# Patient Record
Sex: Male | Born: 1965 | Race: White | Hispanic: No | Marital: Married | Smoking: Never smoker
Health system: Southern US, Community
[De-identification: ages and names within clinical notes are randomized; demographics above are authoritative.]

## PROBLEM LIST (undated history)

## (undated) DIAGNOSIS — K52832 Lymphocytic colitis: Secondary | ICD-10-CM

## (undated) DIAGNOSIS — I10 Essential (primary) hypertension: Secondary | ICD-10-CM

## (undated) DIAGNOSIS — E7800 Pure hypercholesterolemia, unspecified: Secondary | ICD-10-CM

## (undated) DIAGNOSIS — F419 Anxiety disorder, unspecified: Secondary | ICD-10-CM

## (undated) HISTORY — PX: TONSILLECTOMY AND ADENOIDECTOMY: SUR1326

## (undated) HISTORY — DX: Pure hypercholesterolemia, unspecified: E78.00

## (undated) HISTORY — DX: Lymphocytic colitis: K52.832

## (undated) HISTORY — PX: FOOT SURGERY: SHX648

---

## 2011-12-18 ENCOUNTER — Encounter: Payer: Self-pay | Admitting: *Deleted

## 2013-02-03 ENCOUNTER — Emergency Department (HOSPITAL_BASED_OUTPATIENT_CLINIC_OR_DEPARTMENT_OTHER): Payer: BC Managed Care – PPO

## 2013-02-03 ENCOUNTER — Encounter (HOSPITAL_BASED_OUTPATIENT_CLINIC_OR_DEPARTMENT_OTHER): Payer: Self-pay | Admitting: *Deleted

## 2013-02-03 ENCOUNTER — Emergency Department (HOSPITAL_BASED_OUTPATIENT_CLINIC_OR_DEPARTMENT_OTHER)
Admission: EM | Admit: 2013-02-03 | Discharge: 2013-02-03 | Disposition: A | Discharge status: Home / Self Care | Payer: BC Managed Care – PPO | Attending: Emergency Medicine | Admitting: Emergency Medicine

## 2013-02-03 DIAGNOSIS — Z862 Personal history of diseases of the blood and blood-forming organs and certain disorders involving the immune mechanism: Secondary | ICD-10-CM | POA: Insufficient documentation

## 2013-02-03 DIAGNOSIS — F411 Generalized anxiety disorder: Secondary | ICD-10-CM | POA: Insufficient documentation

## 2013-02-03 DIAGNOSIS — Z79899 Other long term (current) drug therapy: Secondary | ICD-10-CM | POA: Insufficient documentation

## 2013-02-03 DIAGNOSIS — R4789 Other speech disturbances: Secondary | ICD-10-CM | POA: Insufficient documentation

## 2013-02-03 DIAGNOSIS — R42 Dizziness and giddiness: Secondary | ICD-10-CM | POA: Insufficient documentation

## 2013-02-03 DIAGNOSIS — I10 Essential (primary) hypertension: Secondary | ICD-10-CM | POA: Insufficient documentation

## 2013-02-03 DIAGNOSIS — G459 Transient cerebral ischemic attack, unspecified: Secondary | ICD-10-CM

## 2013-02-03 DIAGNOSIS — Z8639 Personal history of other endocrine, nutritional and metabolic disease: Secondary | ICD-10-CM | POA: Insufficient documentation

## 2013-02-03 HISTORY — DX: Anxiety disorder, unspecified: F41.9

## 2013-02-03 HISTORY — DX: Essential (primary) hypertension: I10

## 2013-02-03 LAB — COMPREHENSIVE METABOLIC PANEL
ALT: 66 U/L — ABNORMAL HIGH (ref 0–53)
AST: 32 U/L (ref 0–37)
Albumin: 4.3 g/dL (ref 3.5–5.2)
Alkaline Phosphatase: 67 U/L (ref 39–117)
GFR calc Af Amer: >90 mL/min (ref >90)
Glucose, Bld: 96 mg/dL (ref 70–99)
Potassium: 4 mEq/L (ref 3.5–5.1)
Sodium: 141 mEq/L (ref 135–145)
Total Protein: 7 g/dL (ref 6.0–8.3)

## 2013-02-03 LAB — DIFFERENTIAL
Basophils Relative: 0 % (ref 0–1)
Eosinophils Absolute: 0.1 10*3/uL (ref 0.0–0.7)
Eosinophils Relative: 2 % (ref 0–5)
Lymphocytes Relative: 32 % (ref 12–46)
Neutrophils Relative %: 58 % (ref 43–77)

## 2013-02-03 LAB — PROTIME-INR: INR: 0.89 (ref 0.00–1.49)

## 2013-02-03 LAB — APTT: aPTT: 31 seconds (ref 24–37)

## 2013-02-03 LAB — CBC
Hemoglobin: 15.6 g/dL (ref 13.0–17.0)
MCHC: 37.3 g/dL — ABNORMAL HIGH (ref 30.0–36.0)
RBC: 4.86 MIL/uL (ref 4.22–5.81)

## 2013-02-03 LAB — URINALYSIS, ROUTINE W REFLEX MICROSCOPIC
Ketones, ur: NEGATIVE mg/dL
Leukocytes, UA: NEGATIVE
Nitrite: NEGATIVE
Protein, ur: NEGATIVE mg/dL
Urobilinogen, UA: 0.2 mg/dL (ref 0.0–1.0)

## 2013-02-03 LAB — RAPID URINE DRUG SCREEN, HOSP PERFORMED
Amphetamines: NOT DETECTED
Barbiturates: NOT DETECTED
Benzodiazepines: NOT DETECTED
Tetrahydrocannabinol: NOT DETECTED

## 2013-02-03 NOTE — ED Notes (Signed)
Was in a meeting at work 2 hours ago when he got slurred speech, confused, dizzy, and left side of his face was numb.  Symptoms lasted about a minute and resolved spontaneously. He drove himself to [facility] and was advised to come here. He is ambulatory without difficulty. Left side of his face remains numb. No facial droop.

## 2013-02-03 NOTE — ED Provider Notes (Signed)
History     CSN: [id number]  Arrival date & time 02/03/13  1201   First MD Initiated Contact with Patient 02/03/13 1234      Chief Complaint  Patient presents with  . 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 He had the sudden onset of numbness in the left side of his face, slurred speech, dizziness, and lightheadedness. This lasted about a minute, around 9:30 this morning. He still notes some tingling in the left side of his face. Others have been meeting thought he had "zoned out" briefly. He's had no prior similar episodes. He was sitting down when this happened, so he's not sure if he could walk but not at the time this occurred. He doesn't recall any weakness in his arms. The episode lasted several minutes, but now is almost completely resolved, with only some residual numbness in the left side of his face.  Patient is a 47 y.o. male presenting with neurologic complaint.  Neurologic Problem This is a new problem. The current episode started 1 to 2 hours ago. The problem has been rapidly improving. Pertinent negatives include no chest pain, no abdominal pain, no headaches and no shortness of breath. Nothing aggravates the symptoms. Nothing relieves the symptoms. He has tried nothing for the symptoms.    Past Medical History  Diagnosis Date  . Hypercholesteremia   . Hypertension   . Anxiety     Past Surgical History  Procedure Laterality Date  . Tonsillectomy and adenoidectomy      Family History  Problem Relation Age of Onset  . Stroke    . Hypertension    . Coronary artery disease      History  Substance Use Topics  . Smoking status: Never Smoker   . Smokeless tobacco: Not on file  . Alcohol Use: Yes     Comment: occasionally      Review of Systems  Constitutional: Negative.  Negative for fever and chills.  HENT:  Positive for voice change.   Eyes: Negative.   Respiratory: Negative.  Negative for shortness of breath.   Cardiovascular: Negative for chest pain.  Gastrointestinal: Negative.  Negative for abdominal pain.  Genitourinary: Negative.   Musculoskeletal: Negative.   Skin: Negative.   Neurological: Positive for speech difficulty and numbness. Negative for headaches.  Psychiatric/Behavioral: Negative.     Allergies  Review of patient's allergies indicates no known allergies.  Home Medications   Current Outpatient Rx  Name  Route  Sig  Dispense  Refill  . ALPRAZolam (XANAX PO)   Oral   Take by mouth.         . propantheline (PROBANTHINE) 15 MG tablet   Oral   Take 15 mg by mouth 4 (four) times daily.         . propranolol (INDERAL) 10 MG tablet   Oral   Take 10 mg by mouth 2 (two) times daily.         . fish oil-omega-3 fatty acids 1000 MG capsule   Oral   Take 2 g by mouth daily.           BP 140/99  Pulse 71  Temp(Src) 97.9 F (36.6 C) (Oral)  Resp 16  Ht 5\' 10"  (1.778 m)  Wt 180 lb (81.647 kg)  BMI 25.83 kg/m2  SpO2 99%  Physical Exam  Nursing note and vitals reviewed. Constitutional: He is oriented to person, place,  and time. He appears well-developed and well-nourished.  HENT:  Head: Normocephalic and atraumatic.  Right Ear: External ear normal.  Eyes: Conjunctivae and EOM are normal. Pupils are equal, round, and reactive to light.  Neck: Normal range of motion. Neck supple.  Cardiovascular: Normal rate, regular rhythm and normal heart sounds.   Pulmonary/Chest: Effort normal and breath sounds normal.  Abdominal: Soft. Bowel sounds are normal.  Musculoskeletal: Normal range of motion. He exhibits no edema and no tenderness.  Neurological: He is alert and oriented to person, place, and time.  Qualitative slight numbness in left cheek.  Otherwise, no sensory or motor deficit.  Skin: Skin is warm and dry.  Psychiatric: He has a normal mood and affect.  His behavior is normal.    ED Course  Procedures (including critical care time)  12:35 PM  Date: 02/03/2013  Rate:75  Rhythm: normal sinus rhythm  QRS Axis: normal  Intervals: normal  ST/T Wave abnormalities: normal  Conduction Disutrbances:none  Narrative Interpretation: Normal EKG  Old EKG Reviewed: none available   2:31 PM Results for orders placed during the hospital encounter of 02/03/13  ETHANOL      Result Value Range   Alcohol, Ethyl (B) <11  0 - 11 mg/dL  PROTIME-INR      Result Value Range   Prothrombin Time 12.0  11.6 - 15.2 seconds   INR 0.89  0.00 - 1.49  APTT      Result Value Range   aPTT 31  24 - 37 seconds  CBC      Result Value Range   WBC 6.8  4.0 - 10.5 K/uL   RBC 4.86  4.22 - 5.81 MIL/uL   Hemoglobin 15.6  13.0 - 17.0 g/dL   HCT 16.1  09.6 - 04.5 %   MCV 86.0  78.0 - 100.0 fL   MCH 32.1  26.0 - 34.0 pg   MCHC 37.3 (*) 30.0 - 36.0 g/dL   RDW 40.9  81.1 - 91.4 %   Platelets 245  150 - 400 K/uL  DIFFERENTIAL      Result Value Range   Neutrophils Relative % 58  43 - 77 %   Lymphocytes Relative 32  12 - 46 %   Monocytes Relative 8  3 - 12 %   Eosinophils Relative 2  0 - 5 %   Basophils Relative 0  0 - 1 %   Neutro Abs 4.0  1.7 - 7.7 K/uL   Lymphs Abs 2.2  0.7 - 4.0 K/uL   Monocytes Absolute 0.5  0.1 - 1.0 K/uL   Eosinophils Absolute 0.1  0.0 - 0.7 K/uL   Basophils Absolute 0.0  0.0 - 0.1 K/uL   Smear Review MORPHOLOGY UNREMARKABLE    COMPREHENSIVE METABOLIC PANEL      Result Value Range   Sodium 141  135 - 145 mEq/L   Potassium 4.0  3.5 - 5.1 mEq/L   Chloride 104  96 - 112 mEq/L   CO2 24  19 - 32 mEq/L   Glucose, Bld 96  70 - 99 mg/dL   BUN 14  6 - 23 mg/dL   Creatinine, Ser 7.82  0.50 - 1.35 mg/dL   Calcium 9.7  8.4 - 95.6 mg/dL   Total Protein 7.0  6.0 - 8.3 g/dL   Albumin 4.3  3.5 - 5.2 g/dL   AST 32  0 - 37 U/L   ALT 66 (*) 0 - 53 U/L   Alkaline Phosphatase 67  39 - 117 U/L   Total Bilirubin 1.0  0.3 - 1.2 mg/dL   GFR calc non  Af Amer >90  >90 mL/min   GFR calc Af Amer >90  >90 mL/min  TROPONIN I      Result Value Range   Troponin I <0.30  <0.30 ng/mL  URINE RAPID DRUG SCREEN (HOSP PERFORMED)      Result Value Range   Opiates NONE DETECTED  NONE DETECTED   Cocaine NONE DETECTED  NONE DETECTED   Benzodiazepines NONE DETECTED  NONE DETECTED   Amphetamines NONE DETECTED  NONE DETECTED   Tetrahydrocannabinol NONE DETECTED  NONE DETECTED   Barbiturates NONE DETECTED  NONE DETECTED  URINALYSIS, ROUTINE W REFLEX MICROSCOPIC      Result Value Range   Color, Urine YELLOW  YELLOW   APPearance CLEAR  CLEAR   Specific Gravity, Urine 1.006  1.005 - 1.030   pH 6.0  5.0 - 8.0   Glucose, UA NEGATIVE  NEGATIVE mg/dL   Hgb urine dipstick NEGATIVE  NEGATIVE   Bilirubin Urine NEGATIVE  NEGATIVE   Ketones, ur NEGATIVE  NEGATIVE mg/dL   Protein, ur NEGATIVE  NEGATIVE mg/dL   Urobilinogen, UA 0.2  0.0 - 1.0 mg/dL   Nitrite NEGATIVE  NEGATIVE   Leukocytes, UA NEGATIVE  NEGATIVE   Ct Head Wo Contrast  02/03/2013   *RADIOLOGY REPORT*  Clinical Data: Left-sided facial numbness and slurred speech. History of hypertension.  CT HEAD WITHOUT CONTRAST  Technique:  Contiguous axial images were obtained from the base of the skull through the vertex without contrast.  Comparison: None.  Findings: There is no evidence for acute infarction, intracranial hemorrhage, mass lesion, hydrocephalus, or extra-axial fluid. There is slight ventricular prominence, greater than expected for age.  There is also slight cerebellar atrophy most notable in the superior vermis.  Correlate clinically for nutritional, toxic, vascular, or other predisposing factors.  No acute stroke or hemorrhage.  No mass lesion or extra-axial fluid.  Calvarium intact.  Clear sinuses and mastoids.  IMPRESSION: Slight ventricular prominence and slight cerebellar atrophy of uncertain etiology.  No acute intracranial findings.   Original Report Authenticated By: Davonna Belling, M.D.     Lab workup is reassuringly normal.  Call to [facility], as pt's internist is Dr. Albertina Senegal, a [facility] internist, to admit him to complete his TIA workup.    2:36 PM Case discussed with Dr. Otelia Sergeant, who accepts pt in transfer to [facility].   1. Transient ischemic attack        Carleene Cooper III, MD 02/03/13 1436

## 2013-02-03 NOTE — ED Notes (Signed)
MD at bedside. 

## 2013-02-03 NOTE — ED Notes (Signed)
Pt. Is speaking WNL clear concise sentences and has no complaints of pain.

## 2013-11-08 IMAGING — CT CT HEAD W/O CM
1 series · 16 of 30 positions shown, 20 images · non-contrast
Comparison: None.

CLINICAL DATA: Left-sided facial numbness and slurred speech.
History of hypertension.

CT HEAD WITHOUT CONTRAST
TECHNIQUE: Contiguous axial images were obtained from the base of
the skull through the vertex without contrast.

[Series 2: head 4.8 h37s · axial · 0.45mm/px · z∈[-208,-52]mm · 16 of 36 slices shown, 20 images]
[im 2/36  brain]
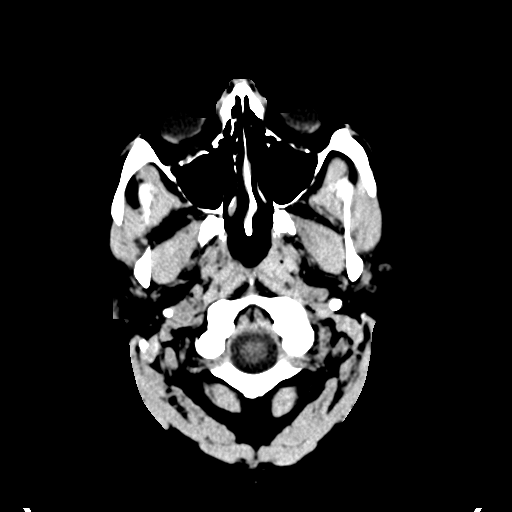
[im 2/36  bone]
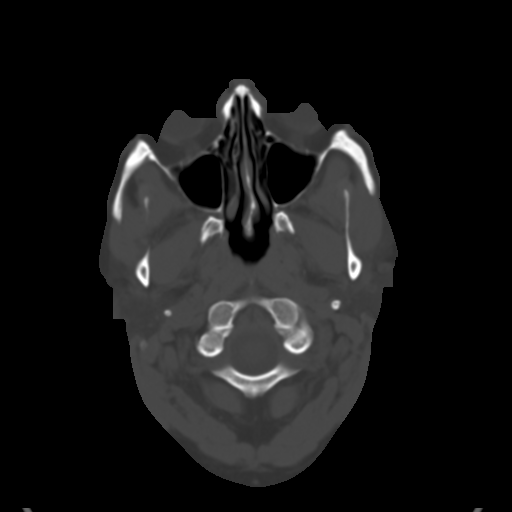
[im 4/36  brain]
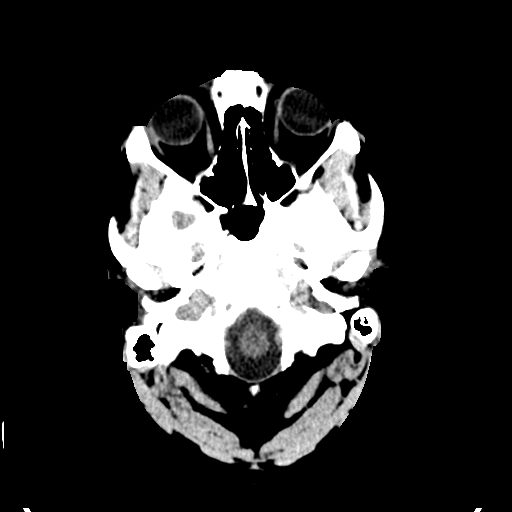
[im 7/36  brain]
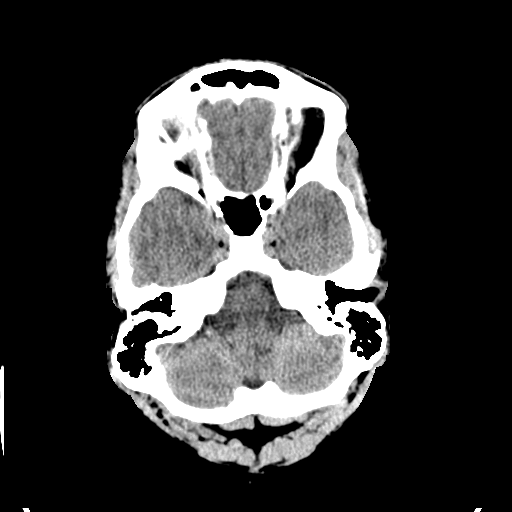
[im 9/36  brain]
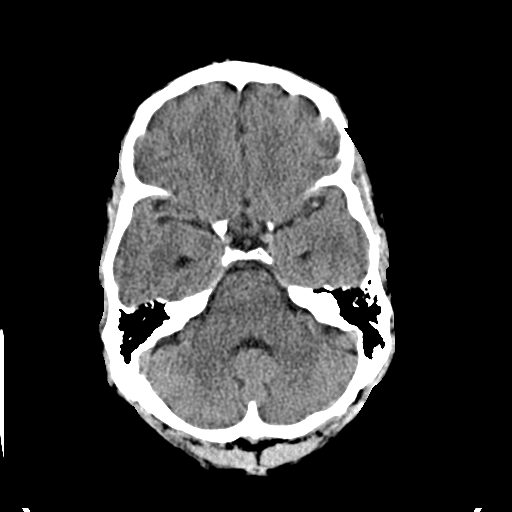
[im 10/36  brain]
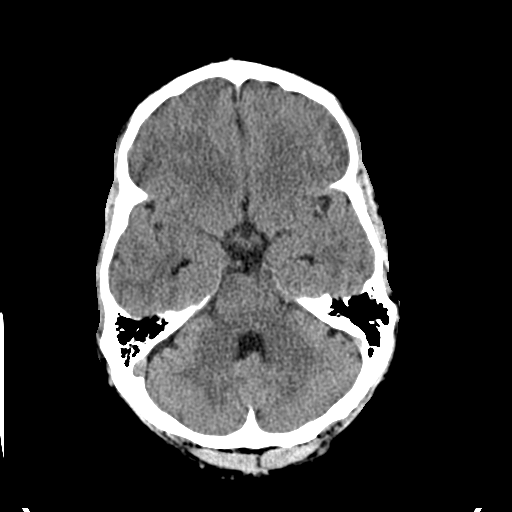
[im 10/36  bone]
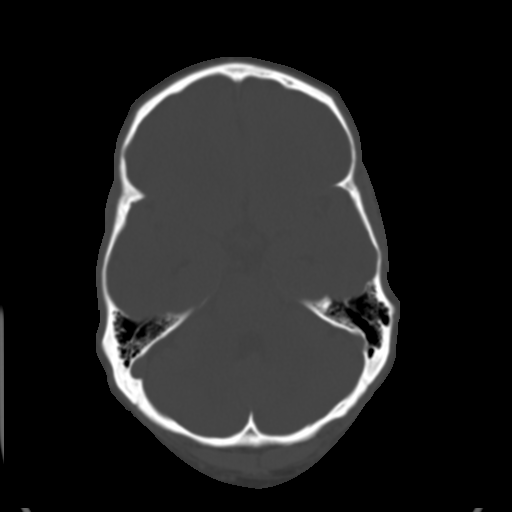
[im 13/36  brain]
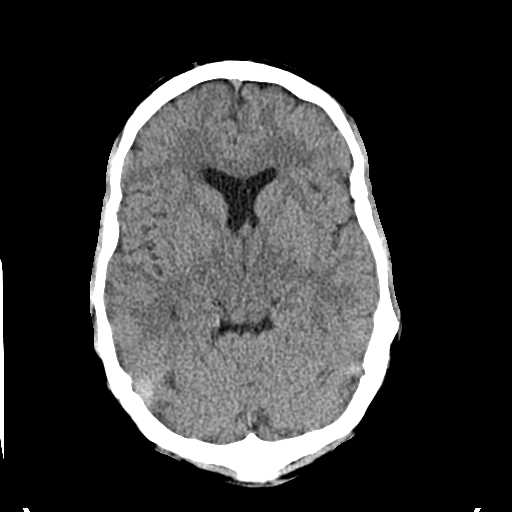
[im 15/36  brain]
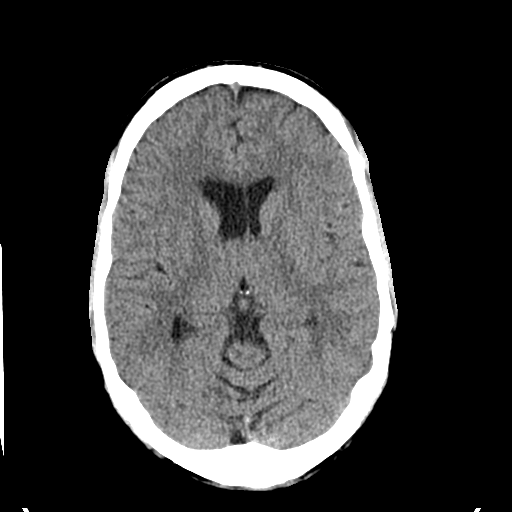
[im 17/36  brain]
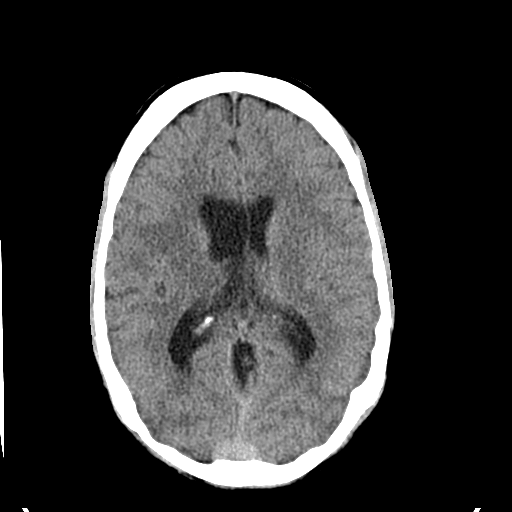
[im 19/36  brain]
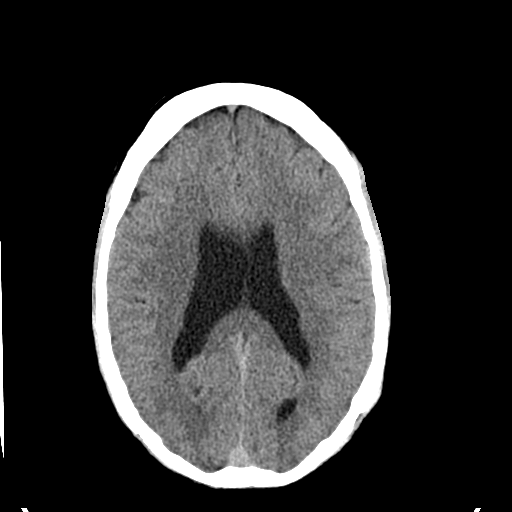
[im 19/36  bone]
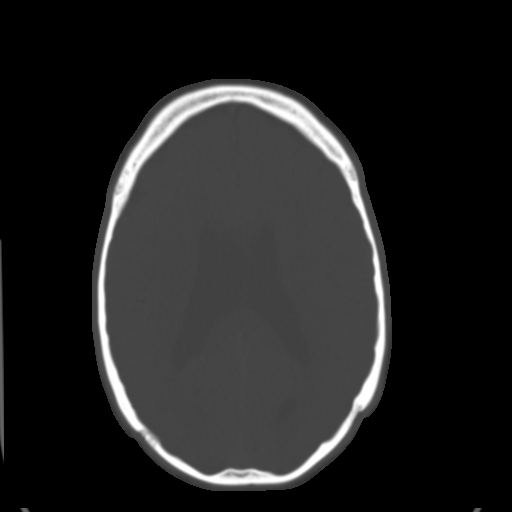
[im 21/36  brain]
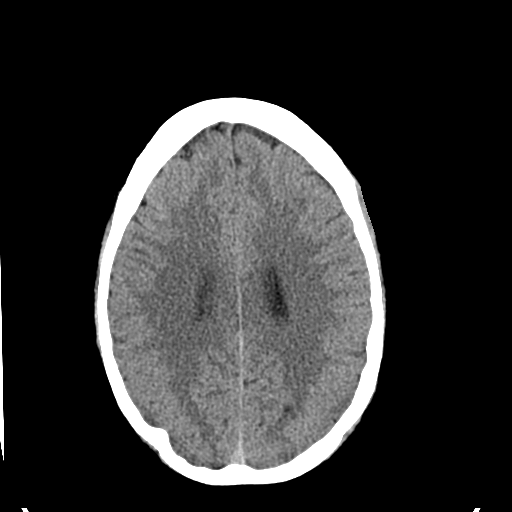
[im 23/36  brain]
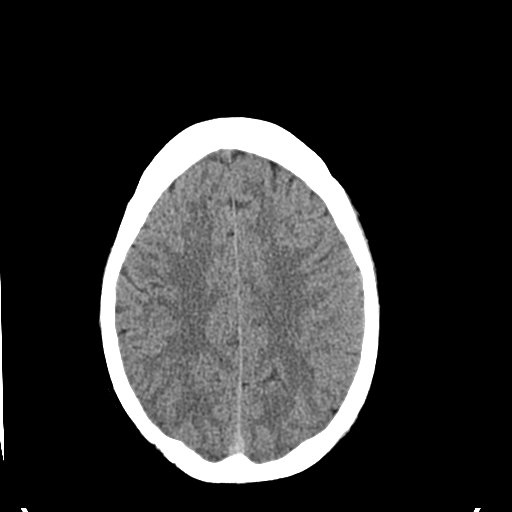
[im 26/36  brain]
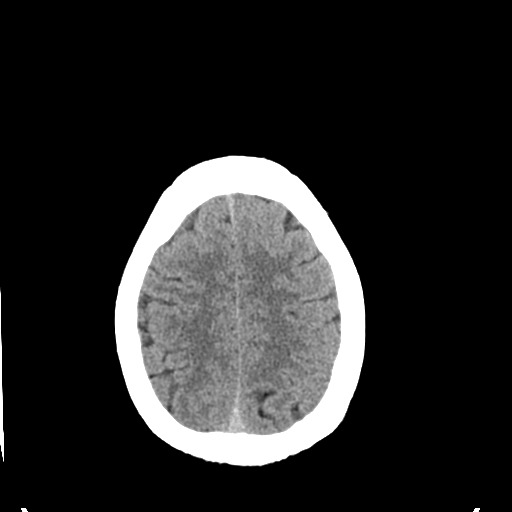
[im 27/36  brain]
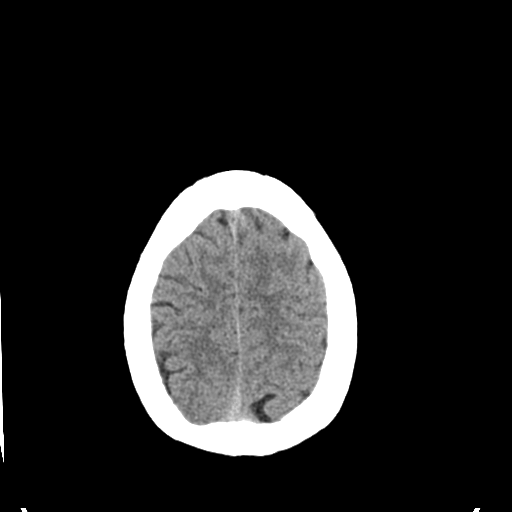
[im 27/36  bone]
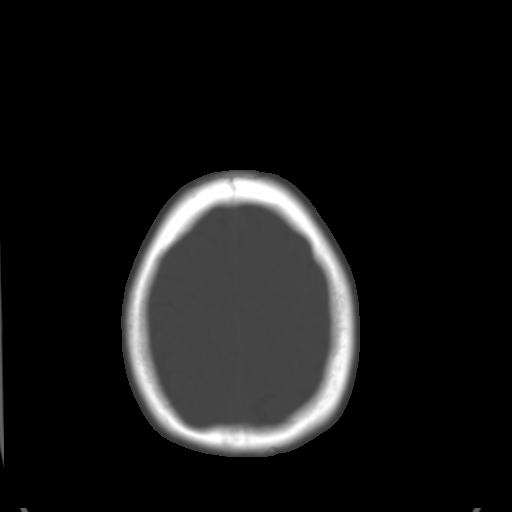
[im 29/36  brain]
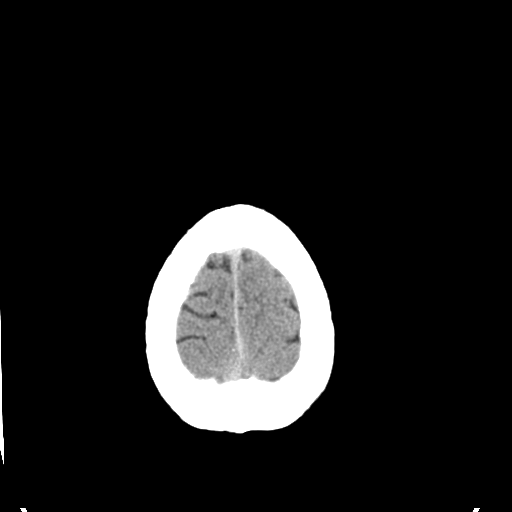
[im 32/36  brain]
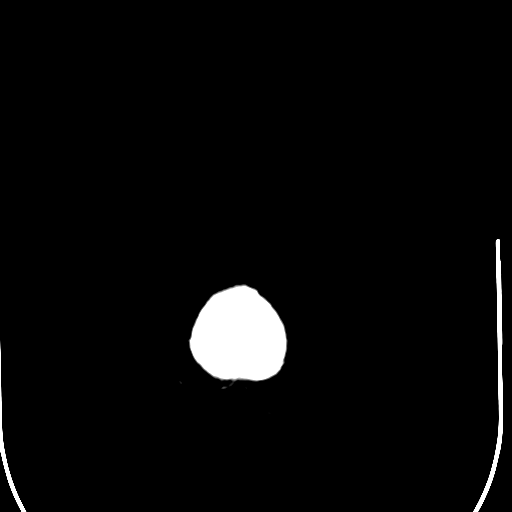
[im 34/36  brain]
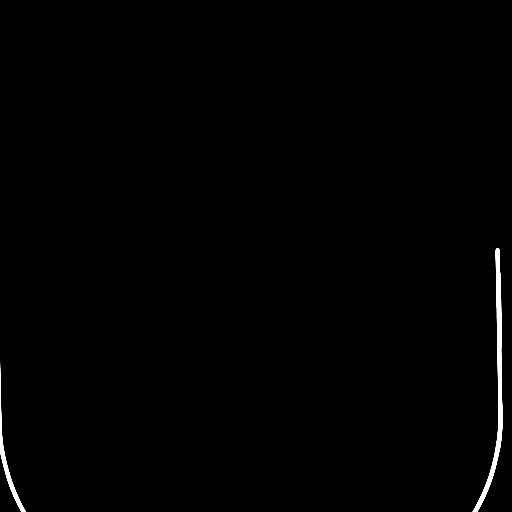

[16 of 30 positions shown; findings below may reference images not displayed]

FINDINGS: There is no evidence for acute infarction, intracranial
hemorrhage, mass lesion, hydrocephalus, or extra-axial fluid.
There is slight ventricular prominence, greater than expected for
age.  There is also slight cerebellar atrophy most notable in the
superior vermis.  Correlate clinically for nutritional, toxic,
vascular, or other predisposing factors.

No acute stroke or hemorrhage.  No mass lesion or extra-axial
fluid.  Calvarium intact.  Clear sinuses and mastoids.
IMPRESSION: Slight ventricular prominence and slight cerebellar atrophy of
uncertain etiology.  No acute intracranial findings.

## 2015-10-17 DIAGNOSIS — F411 Generalized anxiety disorder: Secondary | ICD-10-CM | POA: Insufficient documentation

## 2017-04-20 DIAGNOSIS — K760 Fatty (change of) liver, not elsewhere classified: Secondary | ICD-10-CM | POA: Insufficient documentation

## 2018-03-25 DIAGNOSIS — G5762 Lesion of plantar nerve, left lower limb: Secondary | ICD-10-CM | POA: Insufficient documentation

## 2018-05-07 DIAGNOSIS — G479 Sleep disorder, unspecified: Secondary | ICD-10-CM | POA: Insufficient documentation

## 2018-12-08 DIAGNOSIS — R29898 Other symptoms and signs involving the musculoskeletal system: Secondary | ICD-10-CM | POA: Insufficient documentation

## 2020-03-12 ENCOUNTER — Ambulatory Visit: Payer: Self-pay | Admitting: Family Medicine

## 2020-03-14 ENCOUNTER — Other Ambulatory Visit: Payer: Self-pay

## 2020-03-15 ENCOUNTER — Encounter: Payer: Self-pay | Admitting: Family Medicine

## 2020-03-15 ENCOUNTER — Ambulatory Visit: Payer: BC Managed Care – PPO | Admitting: Family Medicine

## 2020-03-15 VITALS — BP 142/88 | HR 66 | Temp 97.4°F | Ht 68.0 in | Wt 205.6 lb

## 2020-03-15 DIAGNOSIS — I10 Essential (primary) hypertension: Secondary | ICD-10-CM

## 2020-03-15 DIAGNOSIS — E7800 Pure hypercholesterolemia, unspecified: Secondary | ICD-10-CM

## 2020-03-15 DIAGNOSIS — F419 Anxiety disorder, unspecified: Secondary | ICD-10-CM

## 2020-03-15 DIAGNOSIS — G4700 Insomnia, unspecified: Secondary | ICD-10-CM

## 2020-03-15 DIAGNOSIS — E291 Testicular hypofunction: Secondary | ICD-10-CM

## 2020-03-15 NOTE — Progress Notes (Signed)
New Patient Office Visit  Subjective:  Patient ID: Eric Haas, male    DOB: 03/28/1966  Age: 54 y.o. MRN: [id number]  CC:  Chief Complaint  Patient presents with  . Establish Care    New patient, no concerns.     HPI Eric Haas presents for establishment of care.  His physician is retiring.  Past medical history of hypertension, elevated cholesterol, androgen deficiency and anxiety.  Blood pressure controlled with metoprolol, olmesartan.  Cholesterol controlled with rosuvastatin.  Testosterone gel for androgen deficiency.  Over the last 10 years he is taking low-dose Lexapro with Xanax twice daily for anxiety.  He uses Ambien at night for sleep.  He requests to continue this combination of medications and is not interested in varying from it.  Recent physical exam in April of this year.  Labs were reviewed.  He is up-to-date on health maintenance.  Past Medical History:  Diagnosis Date  . Anxiety   . Hypercholesteremia   . Hypertension   . Lymphocytic colitis     Past Surgical History:  Procedure Laterality Date  . FOOT SURGERY Left   . TONSILLECTOMY AND ADENOIDECTOMY      Family History  Problem Relation Age of Onset  . Stroke Other   . Hypertension Other   . Coronary artery disease Other   . Cancer Mother   . Lung cancer Mother   . Hypertension Father   . Heart disease Father     Social History   Socioeconomic History  . Marital status: Married    Spouse name: Not on file  . Number of children: 2  . Years of education: Not on file  . Highest education level: Not on file  Occupational History  . Occupation: Art gallery manager  Tobacco Use  . Smoking status: Never Smoker  . Smokeless tobacco: Former Clinical biochemist  . Vaping Use: Never used  Substance and Sexual Activity  . Alcohol use: Yes    Alcohol/week: 2.0 standard drinks    Types: 1 Cans of beer, 1 Standard drinks or equivalent per week    Comment: rare occasionally  . Drug use: No  . Sexual activity:  Yes  Other Topics Concern  . Not on file  Social History Narrative  . Not on file   Social Determinants of Health   Financial Resource Strain:   . Difficulty of Paying Living Expenses:   Food Insecurity:   . Worried About Programme researcher, broadcasting/film/video in the Last Year:   . Barista in the Last Year:   Transportation Needs:   . Freight forwarder (Medical):   Marland Kitchen Lack of Transportation (Non-Medical):   Physical Activity:   . Days of Exercise per Week:   . Minutes of Exercise per Session:   Stress:   . Feeling of Stress :   Social Connections:   . Frequency of Communication with Friends and Family:   . Frequency of Social Gatherings with Friends and Family:   . Attends Religious Services:   . Active Member of Clubs or Organizations:   . Attends Banker Meetings:   Marland Kitchen Marital Status:   Intimate Partner Violence:   . Fear of Current or Ex-Partner:   . Emotionally Abused:   Marland Kitchen Physically Abused:   . Sexually Abused:     ROS Review of Systems  Constitutional: Negative.   Respiratory: Negative.   Cardiovascular: Negative.   Gastrointestinal: Negative.    Depression screen [facility] 2/9 03/15/2020  Decreased Interest 0  Down, Depressed, Hopeless 0  PHQ - 2 Score 0     Objective:   Today's Vitals: BP (!) 142/88   Pulse 66   Temp (!) 97.4 F (36.3 C) (Tympanic)   Ht 5\' 8"  (1.727 m)   Wt 205 lb 9.6 oz (93.3 kg)   SpO2 96%   BMI 31.26 kg/m   Physical Exam Vitals and nursing note reviewed.  Constitutional:      General: He is not in acute distress.    Appearance: Normal appearance. He is normal weight. He is not ill-appearing, toxic-appearing or diaphoretic.  HENT:     Head: Normocephalic and atraumatic.  Eyes:     General: No scleral icterus.       Right eye: No discharge.        Left eye: No discharge.  Pulmonary:     Effort: Pulmonary effort is normal.  Neurological:     Mental Status: He is alert and oriented to person, place, and time.    Psychiatric:        Mood and Affect: Mood normal.        Behavior: Behavior normal.     Assessment & Plan:   Problem List Items Addressed This Visit      Cardiovascular and Mediastinum   Essential hypertension - Primary   Relevant Medications   olmesartan (BENICAR) 20 MG tablet   rosuvastatin (CRESTOR) 5 MG tablet   sildenafil (VIAGRA) 100 MG tablet   metoprolol succinate (TOPROL-XL) 50 MG 24 hr tablet     Endocrine   Androgen deficiency     Other   Elevated cholesterol   Relevant Medications   olmesartan (BENICAR) 20 MG tablet   rosuvastatin (CRESTOR) 5 MG tablet   sildenafil (VIAGRA) 100 MG tablet   metoprolol succinate (TOPROL-XL) 50 MG 24 hr tablet   Anxiety   Relevant Medications   escitalopram (LEXAPRO) 5 MG tablet   Insomnia      Outpatient Encounter Medications as of 03/15/2020  Medication Sig  . ALPRAZolam (XANAX PO) Take by mouth.  . Coenzyme Q10 100 MG capsule Take by mouth.  . escitalopram (LEXAPRO) 5 MG tablet Take 5 mg by mouth daily.  . fish oil-omega-3 fatty acids 1000 MG capsule Take 2 g by mouth daily.  . metoprolol succinate (TOPROL-XL) 50 MG 24 hr tablet Take 50 mg by mouth daily.  . Multiple Vitamin (MULTIVITAMIN) capsule Take 1 capsule by mouth daily.  05/16/2020 olmesartan (BENICAR) 20 MG tablet Take 20 mg by mouth daily.  . propantheline (PROBANTHINE) 15 MG tablet Take 15 mg by mouth 4 (four) times daily.  . propranolol (INDERAL) 10 MG tablet Take 10 mg by mouth 2 (two) times daily.  . rosuvastatin (CRESTOR) 5 MG tablet Take 5 mg by mouth daily.  . sildenafil (VIAGRA) 100 MG tablet   . Testosterone 20.25 MG/1.25GM (1.62%) GEL Apply 1 packet topically daily.  Marland Kitchen triamcinolone cream (KENALOG) 0.1 % APPLY TWICE A DAY AS NEEDED FOR ITCHING  . zolpidem (AMBIEN) 10 MG tablet Take 10 mg by mouth at bedtime.   No facility-administered encounter medications on file as of 03/15/2020.    Follow-up: Return if symptoms worsen or fail to improve, for Will follow  up with another physician. .   Advised patient that I would be uncomfortable with his current medical regimen for anxiety and insomnia.  I would refer him for care through psychiatry.  I would be happy to follow him for his other medical  issues.  He said that he would rather stay with 1 physician for all of his medical problems.  He will seek care elsewhere. Mliss Sax, MD

## 2020-04-13 DIAGNOSIS — E291 Testicular hypofunction: Secondary | ICD-10-CM | POA: Insufficient documentation

## 2020-04-13 DIAGNOSIS — F3342 Major depressive disorder, recurrent, in full remission: Secondary | ICD-10-CM | POA: Insufficient documentation

## 2020-04-13 DIAGNOSIS — G4733 Obstructive sleep apnea (adult) (pediatric): Secondary | ICD-10-CM | POA: Insufficient documentation

## 2020-05-30 DIAGNOSIS — U071 COVID-19: Secondary | ICD-10-CM | POA: Insufficient documentation

## 2020-10-10 DIAGNOSIS — K52832 Lymphocytic colitis: Secondary | ICD-10-CM | POA: Insufficient documentation

## 2021-07-15 DIAGNOSIS — F1320 Sedative, hypnotic or anxiolytic dependence, uncomplicated: Secondary | ICD-10-CM | POA: Insufficient documentation

## 2022-05-09 DIAGNOSIS — Z6830 Body mass index (BMI) 30.0-30.9, adult: Secondary | ICD-10-CM | POA: Insufficient documentation

## 2022-05-09 DIAGNOSIS — E66811 Obesity, class 1: Secondary | ICD-10-CM | POA: Insufficient documentation

## 2022-05-09 DIAGNOSIS — R7989 Other specified abnormal findings of blood chemistry: Secondary | ICD-10-CM | POA: Insufficient documentation

## 2022-05-09 DIAGNOSIS — H9193 Unspecified hearing loss, bilateral: Secondary | ICD-10-CM | POA: Insufficient documentation

## 2022-11-04 ENCOUNTER — Ambulatory Visit: Payer: BC Managed Care – PPO | Admitting: Cardiology

## 2022-11-04 ENCOUNTER — Encounter: Payer: Self-pay | Admitting: Cardiology

## 2022-11-04 VITALS — BP 125/76 | HR 80 | Ht 70.0 in | Wt 211.8 lb

## 2022-11-04 DIAGNOSIS — E782 Mixed hyperlipidemia: Secondary | ICD-10-CM

## 2022-11-04 DIAGNOSIS — I10 Essential (primary) hypertension: Secondary | ICD-10-CM

## 2022-11-04 DIAGNOSIS — K760 Fatty (change of) liver, not elsewhere classified: Secondary | ICD-10-CM

## 2022-11-04 MED ORDER — OLMESARTAN MEDOXOMIL-HCTZ 40-12.5 MG PO TABS: 1.0000 | ORAL_TABLET | Freq: Every day | ORAL | 2 refills | Status: DC

## 2022-11-04 MED ORDER — NEBIVOLOL HCL 20 MG PO TABS: 1.0000 | ORAL_TABLET | Freq: Every evening | ORAL | 2 refills | Status: DC

## 2022-11-04 NOTE — Progress Notes (Signed)
Primary Physician/Referring:  Vickii Penna, MD  Patient ID: Eric Haas, male    DOB: 1965/11/27, 57 y.o.   MRN: [id number]  Chief Complaint  Patient presents with   Hypertension   New Patient (Initial Visit)   HPI:    Eric Haas  is a 57 y.o. Caucasian male patient with hypertension, hyperlipidemia, hypogonadism referred to me for evaluation of and management of hypertension and cardiac risk stratification.  Patient's father was a patient of mine with coronary artery disease and CABG in his late years.  He is accompanied by his wife today.  Although he is active and does yard work without any limitations, he has no set exercise program.  Otherwise remains asymptomatic.  Past Medical History:  Diagnosis Date   Anxiety    Hypercholesteremia    Hypertension    Lymphocytic colitis    Past Surgical History:  Procedure Laterality Date   FOOT SURGERY Left    TONSILLECTOMY AND ADENOIDECTOMY     Family History  Problem Relation Age of Onset   Cancer Mother    Lung cancer Mother    Hypertension Father    Heart disease Father    Stroke Other    Hypertension Other    Coronary artery disease Other     Social History   Tobacco Use   Smoking status: Never   Smokeless tobacco: Former    Types: Chew  Substance Use Topics   Alcohol use: Yes    Alcohol/week: 2.0 standard drinks of alcohol    Types: 1 Cans of beer, 1 Standard drinks or equivalent per week    Comment: rare occasionally   Marital Status: Married  ROS  Review of Systems  Cardiovascular:  Negative for chest pain, dyspnea on exertion and leg swelling.   Objective      11/04/2022    1:06 PM 03/15/2020    3:03 PM 02/03/2013    3:38 PM  Vitals with BMI  Height 5' 10"$  5' 8"$    Weight 211 lbs 13 oz 205 lbs 10 oz   BMI 123456 [id number]   Systolic 0000000 [id number]  Diastolic 76 88 94  Pulse 80 66 72   SpO2: 96 %  Physical Exam Constitutional:      Appearance: He is obese.  Neck:     Vascular: No carotid bruit  or JVD.  Cardiovascular:     Rate and Rhythm: Normal rate and regular rhythm.     Pulses: Intact distal pulses.     Heart sounds: Normal heart sounds. No murmur heard.    No gallop.  Pulmonary:     Effort: Pulmonary effort is normal.     Breath sounds: Normal breath sounds.  Abdominal:     General: Bowel sounds are normal.     Palpations: Abdomen is soft.  Musculoskeletal:     Right lower leg: No edema.     Left lower leg: No edema.     Medications and allergies   Allergies  Allergen Reactions   Darvocet [Propoxyphene N-Acetaminophen]      Medication list   Current Outpatient Medications:    ALPRAZolam (XANAX PO), Take 0.5 mg by mouth 2 (two) times daily., Disp: , Rfl:    aspirin EC 81 MG tablet, Take 81 mg by mouth daily. Swallow whole., Disp: , Rfl:    Coenzyme Q10 100 MG capsule, Take by mouth., Disp: , Rfl:    fish oil-omega-3 fatty acids 1000 MG capsule, Take 2 g by mouth daily., Disp: ,  Rfl:    Melatonin 10 MG TABS, Take by mouth., Disp: , Rfl:    Multiple Vitamin (MULTIVITAMIN) capsule, Take 1 capsule by mouth daily., Disp: , Rfl:    Nebivolol HCl (BYSTOLIC) 20 MG TABS, Take 1 tablet (20 mg total) by mouth every evening., Disp: 30 tablet, Rfl: 2   olmesartan-hydrochlorothiazide (BENICAR HCT) 40-12.5 MG tablet, Take 1 tablet by mouth daily., Disp: 30 tablet, Rfl: 2   rosuvastatin (CRESTOR) 5 MG tablet, Take 5 mg by mouth daily., Disp: , Rfl:    Testosterone 20.25 MG/1.25GM (1.62%) GEL, Apply 1 packet topically daily., Disp: , Rfl:    escitalopram (LEXAPRO) 5 MG tablet, Take 5 mg by mouth daily., Disp: , Rfl:  Laboratory examination:   External labs:   Labs 11/04/2022:  Total cholesterol 180, triglycerides 351, HDL 41, direct LDL 91.  Labs 05/05/2022:  Sodium 142, potassium 4.1, BUN 20, creatinine 1.03, EGFR 86 mL.  Total cholesterol 09/23/1977, triglycerides 247, HDL 45, LDL direct 114.  Non-HDL cholesterol 134.  TSH 1.09.  Free testosterone 198, normal.  Hb  15.0/HCT 44.2, platelets 203.  Normal indicis.  Radiology:   Ultrasound of the abdomen complete 09/07/2019: 1. Increased hepatic echogenicity consistent fatty infiltration or  hepatocellular disease. No gallstones or biliary distention.  2.  1.2 cm simple cyst left kidney.   Cardiac Studies:   NA  EKG:   EKG 11/04/2022: Normal sinus rhythm at rate of 79 bpm, normal EKG.    Assessment     ICD-10-CM   1. Primary hypertension  I10 EKG 12-Lead    PCV CARDIAC STRESS TEST    olmesartan-hydrochlorothiazide (BENICAR HCT) 40-12.5 MG tablet    Nebivolol HCl (BYSTOLIC) 20 MG TABS    2. Mixed hyperlipidemia  E78.2 EKG 12-Lead    CT CARDIAC SCORING (SELF PAY ONLY)    PCV CARDIAC STRESS TEST    Lipid Panel With LDL/HDL Ratio    LDL cholesterol, direct    Lipoprotein A (LPA)    3. Fatty liver  K76.0 CT CARDIAC SCORING (SELF PAY ONLY)       Orders Placed This Encounter  Procedures   CT CARDIAC SCORING (SELF PAY ONLY)    Standing Status:   Future    Standing Expiration Date:   01/03/2023    Order Specific Question:   Preferred imaging location?    Answer:   External    Order Specific Question:   Radiology Contrast Protocol - do NOT remove file path    Answer:   [email or web address]   Lipid Panel With LDL/HDL Ratio   LDL cholesterol, direct   Lipoprotein A (LPA)   PCV CARDIAC STRESS TEST    Standing Status:   Future    Standing Expiration Date:   01/03/2023   EKG 12-Lead    Meds ordered this encounter  Medications   olmesartan-hydrochlorothiazide (BENICAR HCT) 40-12.5 MG tablet    Sig: Take 1 tablet by mouth daily.    Dispense:  30 tablet    Refill:  2   Nebivolol HCl (BYSTOLIC) 20 MG TABS    Sig: Take 1 tablet (20 mg total) by mouth every evening.    Dispense:  30 tablet    Refill:  2    Medications Discontinued During This Encounter  Medication Reason   metoprolol succinate (TOPROL-XL) 50 MG 24 hr tablet Dose change   olmesartan  (BENICAR) 20 MG tablet Change in therapy   propantheline (PROBANTHINE) 15 MG tablet Completed Course   zolpidem (AMBIEN) 10 MG  tablet Completed Course   sildenafil (VIAGRA) 100 MG tablet Completed Course   triamcinolone cream (KENALOG) 0.1 % Completed Course   hydrochlorothiazide (HYDRODIURIL) 12.5 MG tablet Change in therapy   metoprolol succinate (TOPROL-XL) 100 MG 24 hr tablet Change in therapy   telmisartan (MICARDIS) 80 MG tablet Change in therapy     Recommendations:   Eric Haas is a 57 y.o. Caucasian male patient with hypertension, hyperlipidemia, hypogonadism referred to me for evaluation of and management of hypertension and cardiac risk stratification.  Patient's father was a patient of mine with coronary artery disease and CABG in his late years.  He is accompanied by his wife today.  1. Primary hypertension I will discontinue metoprolol succinate, hydrochlorothiazide, telmisartan and switch him to Bystolic 20 mg in the evening and olmesartan HCT 40/12.5 mg in the morning and see whether he would respond well to this combination.  2. Mixed hyperlipidemia Long discussion with the patient and his wife regarding making lifestyle changes.  I have discussed with him that LDL can be genetic however triglycerides are mostly manmade.  Weight loss, calorie reduction, decrease in carbohydrate and fat intake discussed in detail.  I would like to repeat his lipid profile testing in 4 to 6 weeks to see if there is any change.  He is presently on Crestor 5 mg daily, will continue the same for now.  Will obtain coronary calcium score for further cardiac risk stratification and make further recommendations regarding lipid management.  I will also obtain a routine treadmill exercise stress test for screening.  I reviewed external labs.  3. Fatty liver In 2020, patient already had fatty liver.  I have discussed with the patient and his wife regarding the implications.  I would like to see him back  in 6 weeks for follow-up.     Adrian Prows, MD, [facility] 11/04/2022, 2:16 PM Office: [phone]

## 2022-11-10 ENCOUNTER — Ambulatory Visit: Payer: BC Managed Care – PPO

## 2022-11-10 DIAGNOSIS — E782 Mixed hyperlipidemia: Secondary | ICD-10-CM

## 2022-11-10 DIAGNOSIS — I10 Essential (primary) hypertension: Secondary | ICD-10-CM

## 2022-11-10 NOTE — Progress Notes (Signed)
Exercise treadmill stress test 11/10/2022: Exercise treadmill stress test performed using Bruce protocol.  Patient exercised on Bruce protocol for 8 minutes and 6 seconds, reached 10.1 METS, and 77% of age predicted maximum heart rate.  Exercise capacity was good.  No chest pain reported.  Inadequate heart rate response. Normal blood pressure response. Stress EKG at 77% MPHR revealed no ischemic changes. Inconclusive study due to inadequate heart rate response.   Discuss on office visit soon.

## 2022-11-17 ENCOUNTER — Encounter: Payer: Self-pay | Admitting: Cardiology

## 2022-11-17 ENCOUNTER — Telehealth: Payer: Self-pay

## 2022-11-17 DIAGNOSIS — E782 Mixed hyperlipidemia: Secondary | ICD-10-CM

## 2022-11-17 DIAGNOSIS — R931 Abnormal findings on diagnostic imaging of heart and coronary circulation: Secondary | ICD-10-CM

## 2022-11-17 DIAGNOSIS — R9439 Abnormal result of other cardiovascular function study: Secondary | ICD-10-CM

## 2022-11-17 NOTE — Telephone Encounter (Signed)
ICD-10-CM   1. Elevated coronary artery calcium score 11/14/2022: Total score 315, 91 percentile on [place] database  R93.1 PCV MYOCARDIAL PERFUSION WO LEXISCAN    2. Abnormal stress ECG with treadmill  R94.39 PCV MYOCARDIAL PERFUSION WO LEXISCAN    3. Mixed hyperlipidemia  E78.2 PCV MYOCARDIAL PERFUSION WO LEXISCAN     Orders Placed This Encounter  Procedures   PCV MYOCARDIAL PERFUSION WO LEXISCAN    Standing Status:   Future    Standing Expiration Date:   01/17/2023

## 2022-11-17 NOTE — Telephone Encounter (Signed)
Patient received his Calcium coronary score results and wants to know if he needs to be seen sooner than 12/16/2022. Also, patient is going to be quitting his job in July 2024 and wants to speak with you about it, in case other testing or anything else needs to be done before then. Please advise.    Patient has MyChart and does check messages on there.

## 2022-11-17 NOTE — Progress Notes (Signed)
Coronary calcium score 11/14/2022: -- Left Main: 19.  -- Left Anterior Descending: 285.  -- Circumflex: 11.  -- Right Coronary: 0  Total calcium score of 315. This places the patient in the 51 percentile for age and race based on the [facility] data base. Recommend cardiology consultation for further evaluation and management. - Cardiovascular: Cardiomegaly without pericardial effusion.  - Visible abdomen: Diminished attenuation of the liver suggesting fatty infiltration.  - Visible lung fields: Visible portions of the lungs are clear.

## 2022-11-18 NOTE — Telephone Encounter (Signed)
From patient

## 2022-11-26 ENCOUNTER — Other Ambulatory Visit: Payer: Self-pay | Admitting: Cardiology

## 2022-11-26 DIAGNOSIS — I10 Essential (primary) hypertension: Secondary | ICD-10-CM

## 2022-12-11 ENCOUNTER — Ambulatory Visit: Payer: BC Managed Care – PPO

## 2022-12-11 DIAGNOSIS — R9439 Abnormal result of other cardiovascular function study: Secondary | ICD-10-CM

## 2022-12-11 DIAGNOSIS — E782 Mixed hyperlipidemia: Secondary | ICD-10-CM

## 2022-12-11 DIAGNOSIS — R931 Abnormal findings on diagnostic imaging of heart and coronary circulation: Secondary | ICD-10-CM

## 2022-12-11 LAB — LIPID PANEL WITH LDL/HDL RATIO
Cholesterol, Total: 111 mg/dL (ref 100–199)
HDL: 31 mg/dL — ABNORMAL LOW (ref >39)
LDL Chol Calc (NIH): 43 mg/dL (ref 0–99)
LDL/HDL Ratio: 1.4 ratio (ref 0.0–3.6)
Triglycerides: 231 mg/dL — ABNORMAL HIGH (ref 0–149)
VLDL Cholesterol Cal: 37 mg/dL (ref 5–40)

## 2022-12-11 LAB — LDL CHOLESTEROL, DIRECT: LDL Direct: 44 mg/dL (ref 0–99)

## 2022-12-11 LAB — LIPOPROTEIN A (LPA): Lipoprotein (a): 15.1 nmol/L (ref <75.0)

## 2022-12-15 ENCOUNTER — Encounter: Payer: Self-pay | Admitting: Cardiology

## 2022-12-15 ENCOUNTER — Ambulatory Visit: Payer: BC Managed Care – PPO | Admitting: Cardiology

## 2022-12-15 VITALS — BP 109/73 | HR 71 | Ht 70.0 in | Wt 197.0 lb

## 2022-12-15 DIAGNOSIS — I10 Essential (primary) hypertension: Secondary | ICD-10-CM

## 2022-12-15 DIAGNOSIS — R931 Abnormal findings on diagnostic imaging of heart and coronary circulation: Secondary | ICD-10-CM

## 2022-12-15 DIAGNOSIS — E782 Mixed hyperlipidemia: Secondary | ICD-10-CM

## 2022-12-15 NOTE — Progress Notes (Signed)
Primary Physician/Referring:  Vickii Penna, MD  Patient ID: Eric Haas, male    DOB: [birth year], 57 y.o.   MRN: [id number]  Chief Complaint  Patient presents with  . Elevated coronary artery calcium score 11/14/2022: Total scor  . Follow-up   HPI:    Eric Haas  is a 57 y.o. Caucasian male patient with hypertension, hyperlipidemia, hypogonadism referred to me for evaluation of and management of hypertension and cardiac risk stratification.  Patient's father was a patient of mine with coronary artery disease and CABG in his late years.  He is accompanied by his wife today.  Although he is active and does yard work without any limitations, he has no set exercise program.  Otherwise remains asymptomatic.  Past Medical History:  Diagnosis Date  . Anxiety   . Hypercholesteremia   . Hypertension   . Lymphocytic colitis    Past Surgical History:  Procedure Laterality Date  . FOOT SURGERY Left   . TONSILLECTOMY AND ADENOIDECTOMY     Family History  Problem Relation Age of Onset  . Cancer Mother   . Lung cancer Mother   . Hypertension Father   . Heart disease Father   . Stroke Other   . Hypertension Other   . Coronary artery disease Other     Social History   Tobacco Use  . Smoking status: Never  . Smokeless tobacco: Former    Types: Chew  Substance Use Topics  . Alcohol use: Yes    Alcohol/week: 2.0 standard drinks of alcohol    Types: 1 Cans of beer, 1 Standard drinks or equivalent per week    Comment: rare occasionally   Marital Status: Married  ROS  Review of Systems  Cardiovascular:  Negative for chest pain, dyspnea on exertion and leg swelling.   Objective      12/15/2022    2:13 PM 11/04/2022    1:06 PM 03/15/2020    3:03 PM  Vitals with BMI  Height 5\' 10"  5\' 10"  5\' 8"   Weight 197 lbs 211 lbs 13 oz 205 lbs 10 oz  BMI 28.27 123456 [id number]  Systolic 0000000 0000000 [id number]  Diastolic 73 76 88  Pulse 71 80 66   SpO2: 98 %  Physical Exam Neck:     Vascular: No  carotid bruit or JVD.  Cardiovascular:     Rate and Rhythm: Normal rate and regular rhythm.     Pulses: Intact distal pulses.     Heart sounds: Normal heart sounds. No murmur heard.    No gallop.  Pulmonary:     Effort: Pulmonary effort is normal.     Breath sounds: Normal breath sounds.  Abdominal:     General: Bowel sounds are normal.     Palpations: Abdomen is soft.  Musculoskeletal:     Right lower leg: No edema.     Left lower leg: No edema.    Medications and allergies   Allergies  Allergen Reactions  . Darvocet [Propoxyphene N-Acetaminophen]      Medication list   Current Outpatient Medications:  .  ALPRAZolam (XANAX PO), Take 0.5 mg by mouth 2 (two) times daily., Disp: , Rfl:  .  aspirin EC 81 MG tablet, Take 81 mg by mouth daily. Swallow whole., Disp: , Rfl:  .  Coenzyme Q10 100 MG capsule, Take by mouth., Disp: , Rfl:  .  fish oil-omega-3 fatty acids 1000 MG capsule, Take 2 g by mouth daily., Disp: , Rfl:  .  Melatonin  10 MG TABS, Take by mouth., Disp: , Rfl:  .  Multiple Vitamin (MULTIVITAMIN) capsule, Take 1 capsule by mouth daily., Disp: , Rfl:  .  olmesartan-hydrochlorothiazide (BENICAR HCT) 40-12.5 MG tablet, TAKE 1 TABLET BY MOUTH EVERY DAY, Disp: 90 tablet, Rfl: 1 .  Probiotic Product (PROBIOTIC-10 PO), Take 1 capsule by mouth daily at 2 PM., Disp: , Rfl:  .  rosuvastatin (CRESTOR) 5 MG tablet, Take 5 mg by mouth daily., Disp: , Rfl:  .  Testosterone 20.25 MG/1.25GM (1.62%) GEL, Apply 1 packet topically daily., Disp: , Rfl:  .  Nebivolol HCl 20 MG TABS, Take 0.5 tablets (10 mg total) by mouth every evening., Disp: 90 tablet, Rfl: 1 Laboratory examination:   External labs:   Lipid profile 12/10/2022   Total cholesterol 111, triglycerides 231, HDL 31, LDL 43.  LDL direct 44.  Labs 11/04/2022:  Total cholesterol 180, triglycerides 351, HDL 41, direct LDL 91.  Labs 05/05/2022:  Sodium 142, potassium 4.1, BUN 20, creatinine 1.03, EGFR 86 mL.  Total  cholesterol 09/23/1977, triglycerides 247, HDL 45, LDL direct 114.  Non-HDL cholesterol 134.  TSH 1.09.  Free testosterone 198, normal.  Hb 15.0/HCT 44.2, platelets 203.  Normal indicis.  Radiology:   Ultrasound of the abdomen complete 09/07/2019: 1. Increased hepatic echogenicity consistent fatty infiltration or  hepatocellular disease. No gallstones or biliary distention.  2.  1.2 cm simple cyst left kidney.   Cardiac Studies:   Coronary calcium score 11/14/2022: -- Left Main: 19.  -- Left Anterior Descending: 285.  -- Circumflex: 11.  -- Right Coronary: 0  Total calcium score of 315. This places the patient in the 18 percentile for age and race based on the [place] data base. Recommend cardiology consultation for further evaluation and management. - Cardiovascular:  Cardiomegaly without pericardial effusion.  - Visible abdomen: Diminished attenuation of the liver suggesting fatty infiltration.  - Visible lung fields: Visible portions of the lungs are clear.   Coronary calcium score 07/13/2018: Absurd score is 52.8, 81st percentile on the [place] 12/11/2022  Narrative Exercise nuclear stress test 12/11/2022 Myocardial perfusion is normal. Overall LV systolic function is normal without regional wall motion abnormalities. Stress LV EF: 70%. Low risk study. Nondiagnostic ECG stress due to target HR not reached. The patient exercised for 12 minutes and 0 seconds of a Bruce protocol, achieving approximately 13.8 METs and 72% MPHR. Peak EKG/ECG demonstrated sinus tachycardia. The heart rate response was normal. The blood pressure response was normal. No previous exam available for comparison.  PCV CARDIAC STRESS TEST 11/10/2022  Narrative Exercise treadmill stress test 11/10/2022: Exercise treadmill stress test performed using Bruce protocol.  Patient reached 10.1 METS, and 77% of age predicted maximum heart rate.  Exercise capacity was  good.  No chest pain reported.  Inadequate heart rate response. Normal blood pressure response. Stress EKG at 77% MPHR revealed no ischemic changes. Inconclusive study due to inadequate heart rate response.   EKG:   EKG 11/04/2022: Normal sinus rhythm at rate of 79 bpm, normal EKG.    Assessment     ICD-10-CM   1. Elevated coronary artery calcium score 11/14/2022: Total score 315, 91 percentile on [place]  R93.1     2. Mixed hyperlipidemia  E78.2     3. Primary hypertension  I10 Nebivolol HCl 20 MG TABS       No orders of the defined types were placed in this encounter.   No orders of  the defined types were placed in this encounter.   Medications Discontinued During This Encounter  Medication Reason  . escitalopram (LEXAPRO) 5 MG tablet Patient Preference  . Nebivolol HCl 20 MG TABS Reorder     Recommendations:   Method Sater is a 57 y.o. Caucasian male patient with hypertension, hyperlipidemia, hypogonadism referred to me for evaluation of and management of hypertension and cardiac risk stratification.  Patient's father was a patient of mine with coronary artery disease and CABG in his late years.  He is accompanied by his wife today.  1. Primary hypertension I will discontinue metoprolol succinate, hydrochlorothiazide, telmisartan and switch him to Bystolic 20 mg in the evening and olmesartan HCT 40/12.5 mg in the morning and see whether he would respond well to this combination.  2. Mixed hyperlipidemia Long discussion with the patient and his wife regarding making lifestyle changes.  I have discussed with him that LDL can be genetic however triglycerides are mostly manmade.  Weight loss, calorie reduction, decrease in carbohydrate and fat intake discussed in detail.  I would like to repeat his lipid profile testing in 4 to 6 weeks to see if there is any change.  He is presently on Crestor 5 mg daily, will continue the same for now.  Will obtain coronary calcium score  for further cardiac risk stratification and make further recommendations regarding lipid management.  I will also obtain a routine treadmill exercise stress test for screening.  I reviewed external labs.  3. Fatty liver In 2020, patient already had fatty liver.  I have discussed with the patient and his wife regarding the implications.  I would like to see him back in 6 weeks for follow-up.     Adrian Prows, MD, [facility] 12/15/2022, 3:13 PM Office: [phone]

## 2022-12-16 ENCOUNTER — Ambulatory Visit: Payer: BC Managed Care – PPO | Admitting: Cardiology

## 2023-05-26 ENCOUNTER — Other Ambulatory Visit: Payer: Self-pay | Admitting: Cardiology

## 2023-05-26 DIAGNOSIS — I10 Essential (primary) hypertension: Secondary | ICD-10-CM

## 2023-11-21 ENCOUNTER — Other Ambulatory Visit: Payer: Self-pay | Admitting: Cardiology

## 2023-11-21 DIAGNOSIS — I10 Essential (primary) hypertension: Secondary | ICD-10-CM

## 2023-12-15 ENCOUNTER — Ambulatory Visit: Payer: BC Managed Care – PPO | Admitting: Cardiology

## 2024-02-13 ENCOUNTER — Other Ambulatory Visit: Payer: Self-pay | Admitting: Cardiology

## 2024-02-13 DIAGNOSIS — I10 Essential (primary) hypertension: Secondary | ICD-10-CM

## 2024-03-02 ENCOUNTER — Ambulatory Visit: Attending: Cardiology | Admitting: Cardiology

## 2024-03-02 ENCOUNTER — Encounter: Payer: Self-pay | Admitting: Cardiology

## 2024-03-02 VITALS — BP 144/84 | HR 69 | Resp 16 | Ht 70.0 in | Wt 209.8 lb

## 2024-03-02 DIAGNOSIS — R748 Abnormal levels of other serum enzymes: Secondary | ICD-10-CM | POA: Diagnosis not present

## 2024-03-02 DIAGNOSIS — Z6830 Body mass index (BMI) 30.0-30.9, adult: Secondary | ICD-10-CM

## 2024-03-02 DIAGNOSIS — K760 Fatty (change of) liver, not elsewhere classified: Secondary | ICD-10-CM | POA: Diagnosis not present

## 2024-03-02 DIAGNOSIS — R931 Abnormal findings on diagnostic imaging of heart and coronary circulation: Secondary | ICD-10-CM | POA: Diagnosis not present

## 2024-03-02 DIAGNOSIS — E66811 Obesity, class 1: Secondary | ICD-10-CM

## 2024-03-02 DIAGNOSIS — I10 Essential (primary) hypertension: Secondary | ICD-10-CM

## 2024-03-02 DIAGNOSIS — E6609 Other obesity due to excess calories: Secondary | ICD-10-CM

## 2024-03-02 DIAGNOSIS — E782 Mixed hyperlipidemia: Secondary | ICD-10-CM

## 2024-03-02 NOTE — Patient Instructions (Addendum)
 Medication Instructions:  Your physician recommends that you continue on your current medications as directed. Please refer to the Current Medication list given to you today.  *If you need a refill on your cardiac medications before your next appointment, please call your pharmacy*  Lab Work: none If you have labs (blood work) drawn today and your tests are completely normal, you will receive your results only by: MyChart Message (if you have MyChart) OR A paper copy in the mail If you have any lab test that is abnormal or we need to change your treatment, we will call you to review the results.  Testing/Procedures: none  Follow-Up: At [facility], you and your health needs are our priority.  As part of our continuing mission to provide you with exceptional heart care, our providers are all part of one team.  This team includes your primary Cardiologist (physician) and Advanced Practice Providers or APPs (Physician Assistants and Nurse Practitioners) who all work together to provide you with the care you need, when you need it.  Your next appointment:   As needed  Provider:   Dr Berry Bristol   We recommend signing up for the patient portal called MyChart.  Sign up information is provided on this After Visit Summary.  MyChart is used to connect with patients for Virtual Visits (Telemedicine).  Patients are able to view lab/test results, encounter notes, upcoming appointments, etc.  Non-urgent messages can be sent to your provider as well.   To learn more about what you can do with MyChart, go to [email or web address].   Other Instructions You have been referred to see the pharmacist in our office--June 19 10:45

## 2024-03-02 NOTE — Progress Notes (Signed)
 Cardiology Office Note:  .   Date:  03/03/2024  ID:  Eric Haas, DOB 1965-10-11, MRN [id number] PCP: Patrcia Eric RAMAN, MD  [facility] Providers Cardiologist:  Gordy Bergamo, MD   History of Present Illness: .   Eric Haas is a 58 y.o. Caucasian male patient with hypertension, hyperlipidemia, hypogonadism, elevated coronary calcium score in the 91st percentile on 11/14/2022 presents for annual visit.  He has had a negative treadmill exercise stress test on 11/10/2022 but also performed a nuclear stress test on 12/11/2022 at his request which was again negative for ischemia with excellent exercise tolerance of 13.8 METS.  He now presents for annual visit.  Discussed the use of AI scribe software for clinical note transcription with the patient, who gave verbal consent to proceed.  History of Present Illness Eric Haas is a 58 year old male with hypertension and mixed hyperlipidemia who presents with elevated blood pressure and concerns about liver function. He is accompanied by his wife.  Blood pressure has been elevated over the past few days, with readings around 144/84 mmHg, including today's office visit. He is on nebivolol  20 mg and olmesartan  HCT 40/12.5 mg for hypertension. Recent lab work shows elevated liver enzymes, with a history of fatty liver confirmed by an abdominal ultrasound in 2020. He is concerned about the impact on his cardiovascular health.  He takes rosuvastatin 5 mg for mixed hyperlipidemia. Cholesterol levels have increased, possibly due to dietary changes, including increased red meat consumption. He has experienced recent weight gain, attributed to a job transition and reduced physical activity. His current weight is 209 pounds. He walks occasionally but not regularly. Triglycerides are elevated, and HDL is low, suggesting dietary influences on his lipid profile.  Labs   External Labs:  Care everywhere labs 11/19/2023:  A1c 5.5%.  BUN 17, creatinine 1.04,  EGFR 84 mL, AST elevated at 53 and ALT elevated at 107.  Total cholesterol 156, triglycerides 322, HDL 37, LDL 80.  CBC normal.  Labs 05/21/2023:  Total cholesterol 122, triglycerides 98, HDL 41, LDL 63.  LFTs normal.  TSH normal at 1.348.  A1c 5.3%.  ROS  Review of Systems  Cardiovascular:  Negative for chest pain, dyspnea on exertion and leg swelling.    Physical Exam:   VS:  BP (!) 144/84 (BP Location: Left Arm, Patient Position: Sitting, Cuff Size: Large)   Pulse 69   Resp 16   Ht 5' 10 (1.778 m)   Wt 209 lb 12.8 oz (95.2 kg)   BMI 30.10 kg/m    Wt Readings from Last 3 Encounters:  03/02/24 209 lb 12.8 oz (95.2 kg)  12/15/22 197 lb (89.4 kg)  11/04/22 211 lb 12.8 oz (96.1 kg)    Physical Exam Constitutional:      Appearance: He is obese.  Neck:     Vascular: No carotid bruit or JVD.   Cardiovascular:     Rate and Rhythm: Normal rate and regular rhythm.     Pulses: Intact distal pulses.     Heart sounds: Normal heart sounds. No murmur heard.    No gallop.  Pulmonary:     Effort: Pulmonary effort is normal.     Breath sounds: Normal breath sounds.  Abdominal:     General: Bowel sounds are normal.     Palpations: Abdomen is soft.   Musculoskeletal:     Right lower leg: No edema.     Left lower leg: No edema.    Studies Reviewed: .  Coronary calcium score 11/14/2022: -- Left Main: 19.  -- Left Anterior Descending: 285.  -- Circumflex: 11.  -- Right Coronary: 0  Total calcium score of 315. This places the patient in the 29 percentile for age and race based on the [facility] data base. Recommend cardiology consultation for further evaluation and management. - Cardiovascular:  Cardiomegaly without pericardial effusion.  - Visible abdomen: Diminished attenuation of the liver suggesting fatty infiltration.  - Visible lung fields: Visible portions of the lungs are clear.   EKG:    EKG Interpretation Date/Time:  Wednesday March 02 2024 15:53:50 EDT Ventricular  Rate:  69 PR Interval:  152 QRS Duration:  82 QT Interval:  406 QTC Calculation: 435 R Axis:   84  Text Interpretation: EKG 03/02/2022: Normal sinus rhythm at rate of 69 bpm, normal EKG.  No change from 02/03/2013. Confirmed by Nyjah Denio, Jagadeesh ([id number]) on 03/02/2024 4:39:18 PM    Medications ordered    No orders of the defined types were placed in this encounter.    ASSESSMENT AND PLAN: .      ICD-10-CM   1. Primary hypertension  I10 EKG 12-Lead    2. Elevated coronary artery calcium score 11/14/2022: Total score 315, 91 percentile on [facility] database  R93.1 AMB Referral to [facility] Pharm[facility]    3. Hepatic steatosis  K76.0     4. Abnormal liver enzymes  R74.8     5. Class 1 obesity due to excess calories with serious comorbidity and body mass index (BMI) of 30.0 to 30.9 in adult  E66.811 AMB Referral to [facility] Pharm[facility]   E66.09    Z68.30     6. Mixed hyperlipidemia  E78.2 AMB Referral to [facility] Pharm[facility]      Assessment and Plan Assessment & Plan Primary Hypertension Blood pressure is elevated at 144/84 mmHg, managed with nebivolol  20 mg and olmesartan  HCT 40/12.5 mg. Weight gain and dietary habits may contribute to hypertension. Lifestyle changes are emphasized for blood pressure control. - Continue nebivolol  20 mg and olmesartan  HCT 40/12.5 mg. - Encourage dietary modifications to reduce salt and fat intake. - Promote weight loss to aid in blood pressure control.  Mixed Hyperlipidemia LDL cholesterol levels have increased over six months. Currently on rosuvastatin 5 mg. Elevated liver enzymes preclude statin dosage increase. Dietary habits, including increased red meat consumption, may contribute. Emphasis on primary prevention to avoid cardiovascular events. - Continue rosuvastatin 5 mg. - Refer to pharmacist for cholesterol management and dietary counseling. - Encourage dietary changes to improve lipid profile. - Goal LDL <70.  This is in the absence of significant CAD but  presence of coronary atherosclerosis and elevated coronary calcium score.  Elevated Liver Enzymes Liver enzymes have increased significantly since last normal labs in September 2024. No current medications known to elevate liver enzymes. Possible fatty liver as a contributing factor. Risk of progression to cirrhosis and liver cancer discussed. - Encourage weight loss to improve liver enzyme levels. - Refer to pharmacist for weight management.  Patient willing to start GLP-1 agonist out-of-pocket if needed.  Fatty Liver Fatty liver confirmed by ultrasound in 2020 and also during coronary calcium score imaging on 11/14/2022 posing a significant risk for cirrhosis. Current weight gain and dietary habits may exacerbate condition. Risk of progression to cirrhosis and liver cancer discussed. Emphasis on weight loss to prevent progression. - Strongly encourage weight loss through dietary changes and increased physical activity. - Consider GLP-1 agonist therapy for weight loss, with referral to pharmacist for management.  Obesity BMI is 30 with significant truncal obesity. Weight gain attributed to lifestyle changes and dietary habits. Obesity contributes to elevated liver enzymes and hypertension. Emphasis on weight loss to improve overall health and prevent complications. - Refer to pharmacist for weight management and potential GLP-1 agonist therapy. - Encourage regular physical activity and dietary modifications to achieve weight loss.  From cardiac standpoint his risk factors are addressed although presently not well-controlled but can be managed from PCP standpoint and I will see him back on a as needed basis.   Signed,  Gordy Bergamo, MD, [facility] 03/03/2024, 3:32 AM [facility] [address] [place], [place] [address] Phone: [phone]. Fax:  [phone]

## 2024-03-03 ENCOUNTER — Telehealth: Payer: Self-pay | Admitting: Pharmacy Technician

## 2024-03-03 ENCOUNTER — Ambulatory Visit: Attending: Cardiovascular Disease | Admitting: Pharmacist

## 2024-03-03 ENCOUNTER — Telehealth: Payer: Self-pay | Admitting: Pharmacist

## 2024-03-03 ENCOUNTER — Other Ambulatory Visit (HOSPITAL_COMMUNITY): Payer: Self-pay

## 2024-03-03 ENCOUNTER — Encounter: Payer: Self-pay | Admitting: Pharmacist

## 2024-03-03 DIAGNOSIS — E782 Mixed hyperlipidemia: Secondary | ICD-10-CM

## 2024-03-03 DIAGNOSIS — R931 Abnormal findings on diagnostic imaging of heart and coronary circulation: Secondary | ICD-10-CM

## 2024-03-03 DIAGNOSIS — E66811 Obesity, class 1: Secondary | ICD-10-CM

## 2024-03-03 DIAGNOSIS — E6609 Other obesity due to excess calories: Secondary | ICD-10-CM

## 2024-03-03 DIAGNOSIS — I10 Essential (primary) hypertension: Secondary | ICD-10-CM

## 2024-03-03 NOTE — Telephone Encounter (Signed)
 Please complete PA for Repatha and/or Praluent

## 2024-03-03 NOTE — Telephone Encounter (Signed)
 Pharmacy Patient Advocate Encounter   Received notification from Pt Calls Messages that prior authorization for Wegovy 0.25 MG is required/requested.   Insurance verification completed.   The patient is insured through [employer] .   Per test claim: PA required; PA submitted to above mentioned insurance via CoverMyMeds Key/confirmation #/EOC [id number] Status is pending

## 2024-03-03 NOTE — Telephone Encounter (Signed)
 Pharmacy Patient Advocate Encounter   Received notification from Pt Calls Messages that prior authorization for Repatha is required/requested.   Insurance verification completed.   The patient is insured through [employer] .   Per test claim: The current 03/03/24 day co-pay is, $70.00- one month.  No PA needed at this time. This test claim was processed through [facility]- copay amounts may vary at other pharmacies due to pharmacy/plan contracts, or as the patient moves through the different stages of their insurance plan.

## 2024-03-03 NOTE — Patient Instructions (Addendum)
 It was nice meeting you two today  We would like your LDL (bad cholesterol) to be less than 70  The medication we discussed today is called Repatha which is an injection you would administer once every 2 weeks  Once you start the medication we will recheck your fasting lipid panel in 2-3 months  The weight loss medications we discussed are called Wegovy and Zepbound, both are administered once a week  I will submit prior authorizations for all 3 and let you know their response  Please message or call with any questions  Joelene Murrain, PharmD, BCACP, CDCES, CPP [facility] [address], [place], [place] [address] Phone: [phone]; Fax: [phone] 03/03/2024 11:29 AM

## 2024-03-03 NOTE — Progress Notes (Signed)
 Patient ID: Eric Haas                 DOB: 1966/03/03                    MRN: [id number]     HPI: Eric Haas is a 58 y.o. male patient referred to lipid and weight loss clinic by Dr Berry Bristol. PMH is significant for HTN, OSA, fatty liver disease, CAD, and obesity.   Patient presents today with wife. Reports has been gradually gaining weight over the years since transitioning to new financial position. Previously very active with wife but now more sedentary.   Currently managed on rosuvastatin 5mg  once daily due to fatty liver disease. ALT 107, AST 53 in March 2025.    Reports he eats many vegetables and proteins but weakness is carbohydrates.   Coronary CT showed calcium score of 315 (90th percentile). Follow up stress test was normal.  Current Medications:  Rosuvastatin 5mg   Intolerances:  N/A  Risk Factors:  HLD Can not take high dose statins Coronary Calcium  LDL goal: <70  Labs: TC 156, Trigs 322 HDL 37, LDL 80  (11/17/23)  Past Medical History:  Diagnosis Date   Anxiety    Hypercholesteremia    Hypertension    Lymphocytic colitis     Current Outpatient Medications on File Prior to Visit  Medication Sig Dispense Refill   ALPRAZolam (XANAX PO) Take 0.5 mg by mouth 2 (two) times daily.     Coenzyme Q10 100 MG capsule Take by mouth.     fish oil-omega-3 fatty acids 1000 MG capsule Take 2 g by mouth daily.     Melatonin 10 MG TABS Take by mouth.     Multiple Vitamin (MULTIVITAMIN) capsule Take 1 capsule by mouth daily.     Nebivolol  HCl 20 MG TABS TAKE 1 TABLET BY MOUTH EVERY DAY IN THE EVENING 30 tablet 0   olmesartan -hydrochlorothiazide (BENICAR  HCT) 40-12.5 MG tablet TAKE 1 TABLET BY MOUTH EVERY DAY 30 tablet 0   Probiotic Product (PROBIOTIC-10 PO) Take 1 capsule by mouth daily at 2 PM.     rosuvastatin (CRESTOR) 5 MG tablet Take 5 mg by mouth daily.     Testosterone 20.25 MG/1.25GM (1.62%) GEL Apply 1 packet topically daily.     No current facility-administered  medications on file prior to visit.    Allergies  Allergen Reactions   Darvocet [Propoxyphene N-Acetaminophen]     Assessment/Plan:  1. Hyperlipidemia - Patient's last LDL was 80 which is above goal of <70. Since statins can not be titrated due to LFTs, recommend addition of PCSK9i.   Using demo pen, educated on mechanism of action, storage, site selection, administration, and possible adverse effects. Will complete PA and contact patient with response. Recheck lipid panel in 3 months.  Continue rosuvastatin 5mg  daily Start Repatha/Praluent q 2 weeks Recheck lipid panel in 3 months  2. Weight loss/CAD - Patients weight has been steadily increasing since switching careers. Encouraged him to be more physically active like he was previously. Recommend at least 30 minutes a day at least 5 days a week. Encouraged increasing vegetables, lean proteins, healthy fats, and whole grains. Decrease carbohydrates and saturated fats.  Recommend addition of GLP1a to help with weight loss, CAD, and fatty liver disease. Using demo pen, educated on mechanism of action, storage, site selection, administration, and possible adverse effects. Will complete PA and contact patient with response.  Start Wegovy/Zepbound q week  Chris Kaula Klenke, PharmD,  BCACP, CDCES, CPP [facility] [address], [place], [place] [address] Phone: [phone]; Fax: [phone] 03/03/2024 12:36 PM

## 2024-03-03 NOTE — Telephone Encounter (Signed)
 Pharmacy Patient Advocate Encounter  Received notification from [employer] that Prior Authorization for Frederik Jansky has been APPROVED from 02/02/24 to 09/29/24. Ran test claim, Copay is $24.99- one month. This test claim was processed through [facility]- copay amounts may vary at other pharmacies due to pharmacy/plan contracts, or as the patient moves through the different stages of their insurance plan.   PA #/Case ID/Reference #: [id number]

## 2024-03-03 NOTE — Telephone Encounter (Signed)
Please complete PA for [facility] and/or Zepbound

## 2024-03-04 MED ORDER — WEGOVY 0.25 MG/0.5ML ~~LOC~~ SOAJ: 0.2500 mg | SUBCUTANEOUS | 0 refills | Status: DC

## 2024-03-04 MED ORDER — REPATHA SURECLICK 140 MG/ML ~~LOC~~ SOAJ
1.0000 mL | SUBCUTANEOUS | 1 refills | Status: DC
Start: 2024-03-04 — End: 2024-05-20

## 2024-03-04 NOTE — Addendum Note (Signed)
 Addended by: Sunny English on: 03/04/2024 04:49 PM   Modules accepted: Orders

## 2024-03-15 ENCOUNTER — Other Ambulatory Visit: Payer: Self-pay | Admitting: Cardiology

## 2024-03-15 DIAGNOSIS — I10 Essential (primary) hypertension: Secondary | ICD-10-CM

## 2024-03-21 MED ORDER — WEGOVY 0.5 MG/0.5ML ~~LOC~~ SOAJ: 0.5000 mg | SUBCUTANEOUS | 0 refills | Status: DC

## 2024-03-21 NOTE — Addendum Note (Signed)
 Addended by: DARRELL BRUCKNER on: 03/21/2024 07:00 AM   Modules accepted: Orders

## 2024-03-25 ENCOUNTER — Other Ambulatory Visit (HOSPITAL_COMMUNITY): Payer: Self-pay

## 2024-03-25 ENCOUNTER — Telehealth: Payer: Self-pay

## 2024-03-25 NOTE — Telephone Encounter (Signed)
 Pharmacy Patient Advocate Encounter   Insurance verification completed.   The patient is insured through [employer] test claim for WEGOVY  .5 MG.     PLAN LIMITATIONS EXCEEDED MAX 1 GLP1 CLAIM PER 21 DAYS   This test claim was processed through [facility]- copay amounts may vary at other pharmacies due to pharmacy/plan contracts, or as the patient moves through the different stages of their insurance plan.

## 2024-04-26 ENCOUNTER — Encounter: Payer: Self-pay | Admitting: Pharmacist

## 2024-04-26 DIAGNOSIS — E66811 Obesity, class 1: Secondary | ICD-10-CM

## 2024-04-26 DIAGNOSIS — E782 Mixed hyperlipidemia: Secondary | ICD-10-CM

## 2024-04-26 MED ORDER — WEGOVY 1 MG/0.5ML ~~LOC~~ SOAJ: 1.0000 mg | SUBCUTANEOUS | 0 refills | Status: DC

## 2024-05-04 ENCOUNTER — Other Ambulatory Visit: Payer: Self-pay | Admitting: Medical Genetics

## 2024-05-20 MED ORDER — REPATHA SURECLICK 140 MG/ML ~~LOC~~ SOAJ
1.0000 mL | SUBCUTANEOUS | 1 refills | Status: AC
Start: 2024-05-20 — End: ?

## 2024-05-20 MED ORDER — WEGOVY 1.7 MG/0.75ML ~~LOC~~ SOAJ: 1.7000 mg | SUBCUTANEOUS | 0 refills | Status: DC

## 2024-05-20 NOTE — Addendum Note (Signed)
 Addended by: DARRELL BRUCKNER on: 05/20/2024 09:27 AM   Modules accepted: Orders

## 2024-05-24 ENCOUNTER — Other Ambulatory Visit

## 2024-05-24 DIAGNOSIS — Z006 Encounter for examination for normal comparison and control in clinical research program: Secondary | ICD-10-CM

## 2024-06-03 LAB — GENECONNECT MOLECULAR SCREEN: Genetic Analysis Overall Interpretation: NEGATIVE

## 2024-06-16 MED ORDER — WEGOVY 2.4 MG/0.75ML ~~LOC~~ SOAJ: 2.4000 mg | SUBCUTANEOUS | 5 refills | Status: AC

## 2024-06-16 NOTE — Addendum Note (Signed)
 Addended by: DARRELL BRUCKNER on: 06/16/2024 11:05 AM   Modules accepted: Orders

## 2024-08-07 ENCOUNTER — Encounter: Payer: Self-pay | Admitting: Pharmacist

## 2024-08-07 DIAGNOSIS — I10 Essential (primary) hypertension: Secondary | ICD-10-CM

## 2024-08-08 MED ORDER — OLMESARTAN MEDOXOMIL-HCTZ 20-12.5 MG PO TABS: 1.0000 | ORAL_TABLET | Freq: Every day | ORAL | 1 refills | Status: DC

## 2024-08-08 MED ORDER — NEBIVOLOL HCL 10 MG PO TABS: 10.0000 mg | ORAL_TABLET | Freq: Every day | ORAL | 1 refills | Status: DC

## 2024-08-08 NOTE — Addendum Note (Signed)
 Addended by: DARRELL BRUCKNER on: 08/08/2024 09:15 AM   Modules accepted: Orders

## 2024-09-11 ENCOUNTER — Other Ambulatory Visit: Payer: Self-pay | Admitting: Cardiology

## 2024-09-11 DIAGNOSIS — I10 Essential (primary) hypertension: Secondary | ICD-10-CM

## 2024-09-13 MED ORDER — OLMESARTAN MEDOXOMIL-HCTZ 20-12.5 MG PO TABS: 1.0000 | ORAL_TABLET | Freq: Every day | ORAL | 3 refills | Status: AC

## 2024-09-13 MED ORDER — NEBIVOLOL HCL 10 MG PO TABS: 10.0000 mg | ORAL_TABLET | Freq: Every day | ORAL | 3 refills | Status: AC
